# Patient Record
Sex: Female | Born: 1965 | Race: White | Hispanic: No | Marital: Married | Smoking: Former smoker
Health system: Southern US, Community
[De-identification: ages and names within clinical notes are randomized; demographics above are authoritative.]

## PROBLEM LIST (undated history)

## (undated) DIAGNOSIS — K219 Gastro-esophageal reflux disease without esophagitis: Secondary | ICD-10-CM

## (undated) DIAGNOSIS — J302 Other seasonal allergic rhinitis: Secondary | ICD-10-CM

## (undated) DIAGNOSIS — E785 Hyperlipidemia, unspecified: Secondary | ICD-10-CM

## (undated) DIAGNOSIS — I10 Essential (primary) hypertension: Secondary | ICD-10-CM

## (undated) HISTORY — PX: TONSILLECTOMY: SUR1361

## (undated) HISTORY — PX: APPENDECTOMY: SHX54

## (undated) HISTORY — DX: Essential (primary) hypertension: I10

## (undated) HISTORY — PX: TUBAL LIGATION: SHX77

## (undated) HISTORY — DX: Gastro-esophageal reflux disease without esophagitis: K21.9

---

## 2003-07-05 HISTORY — PX: CARPAL TUNNEL RELEASE: SHX101

## 2007-03-21 ENCOUNTER — Encounter: Admission: RE | Admit: 2007-03-21 | Discharge: 2007-04-10 | Payer: Self-pay | Admitting: Orthopedic Surgery

## 2008-04-16 ENCOUNTER — Emergency Department (HOSPITAL_COMMUNITY): Admission: EM | Admit: 2008-04-16 | Discharge: 2008-04-16 | Payer: Self-pay | Admitting: Emergency Medicine

## 2008-10-14 ENCOUNTER — Encounter: Admission: RE | Admit: 2008-10-14 | Discharge: 2008-10-14 | Payer: Self-pay | Admitting: Family Medicine

## 2009-10-30 ENCOUNTER — Encounter: Admission: RE | Admit: 2009-10-30 | Discharge: 2009-10-30 | Payer: Self-pay | Admitting: Family Medicine

## 2009-12-26 ENCOUNTER — Encounter: Admission: RE | Admit: 2009-12-26 | Discharge: 2009-12-26 | Payer: Self-pay | Admitting: Orthopedic Surgery

## 2010-08-03 ENCOUNTER — Other Ambulatory Visit: Payer: Self-pay | Admitting: Family Medicine

## 2010-08-03 DIAGNOSIS — Z1239 Encounter for other screening for malignant neoplasm of breast: Secondary | ICD-10-CM

## 2010-11-01 ENCOUNTER — Ambulatory Visit
Admission: RE | Admit: 2010-11-01 | Discharge: 2010-11-01 | Disposition: A | Discharge status: Home / Self Care | Payer: BC Managed Care – PPO | Source: Ambulatory Visit | Attending: Family Medicine | Admitting: Family Medicine

## 2010-11-01 DIAGNOSIS — Z1239 Encounter for other screening for malignant neoplasm of breast: Secondary | ICD-10-CM

## 2011-01-16 ENCOUNTER — Inpatient Hospital Stay (INDEPENDENT_AMBULATORY_CARE_PROVIDER_SITE_OTHER)
Admission: RE | Admit: 2011-01-16 | Discharge: 2011-01-16 | Disposition: A | Discharge status: Home / Self Care | Payer: BC Managed Care – PPO | Source: Ambulatory Visit | Attending: Family Medicine | Admitting: Family Medicine

## 2011-01-16 DIAGNOSIS — K625 Hemorrhage of anus and rectum: Secondary | ICD-10-CM

## 2011-01-16 DIAGNOSIS — K649 Unspecified hemorrhoids: Secondary | ICD-10-CM

## 2011-01-16 LAB — POCT I-STAT, CHEM 8
BUN: 18 mg/dL (ref 6–23)
Creatinine, Ser: 0.9 mg/dL (ref 0.50–1.10)
Hemoglobin: 15.3 g/dL — ABNORMAL HIGH (ref 12.0–15.0)
Potassium: 3.9 mEq/L (ref 3.5–5.1)
Sodium: 136 mEq/L (ref 135–145)

## 2011-04-05 LAB — URINALYSIS, ROUTINE W REFLEX MICROSCOPIC
Bilirubin Urine: NEGATIVE
Ketones, ur: NEGATIVE
Protein, ur: 100 — AB
Urobilinogen, UA: 0.2

## 2011-04-05 LAB — URINE CULTURE: Colony Count: >=100000

## 2011-04-05 LAB — URINE MICROSCOPIC-ADD ON

## 2011-11-01 ENCOUNTER — Other Ambulatory Visit: Payer: Self-pay | Admitting: Family Medicine

## 2011-11-01 DIAGNOSIS — Z1231 Encounter for screening mammogram for malignant neoplasm of breast: Secondary | ICD-10-CM

## 2011-11-10 ENCOUNTER — Ambulatory Visit: Payer: BC Managed Care – PPO

## 2011-11-11 ENCOUNTER — Ambulatory Visit
Admission: RE | Admit: 2011-11-11 | Discharge: 2011-11-11 | Disposition: A | Discharge status: Home / Self Care | Payer: BC Managed Care – PPO | Source: Ambulatory Visit | Attending: Family Medicine | Admitting: Family Medicine

## 2011-11-11 DIAGNOSIS — Z1231 Encounter for screening mammogram for malignant neoplasm of breast: Secondary | ICD-10-CM

## 2011-11-24 DIAGNOSIS — K219 Gastro-esophageal reflux disease without esophagitis: Secondary | ICD-10-CM | POA: Insufficient documentation

## 2011-11-24 DIAGNOSIS — E785 Hyperlipidemia, unspecified: Secondary | ICD-10-CM | POA: Insufficient documentation

## 2011-11-25 ENCOUNTER — Emergency Department (HOSPITAL_BASED_OUTPATIENT_CLINIC_OR_DEPARTMENT_OTHER)
Admission: EM | Admit: 2011-11-25 | Discharge: 2011-11-25 | Disposition: A | Discharge status: Home / Self Care | Payer: BC Managed Care – PPO | Attending: Emergency Medicine | Admitting: Emergency Medicine

## 2011-11-25 ENCOUNTER — Emergency Department (HOSPITAL_BASED_OUTPATIENT_CLINIC_OR_DEPARTMENT_OTHER): Payer: BC Managed Care – PPO

## 2011-11-25 ENCOUNTER — Encounter (HOSPITAL_BASED_OUTPATIENT_CLINIC_OR_DEPARTMENT_OTHER): Payer: Self-pay | Admitting: *Deleted

## 2011-11-25 DIAGNOSIS — K219 Gastro-esophageal reflux disease without esophagitis: Secondary | ICD-10-CM

## 2011-11-25 HISTORY — DX: Hyperlipidemia, unspecified: E78.5

## 2011-11-25 LAB — CBC
Hemoglobin: 13.9 g/dL (ref 12.0–15.0)
MCH: 29 pg (ref 26.0–34.0)
MCHC: 35.1 g/dL (ref 30.0–36.0)
RBC: 4.79 MIL/uL (ref 3.87–5.11)
RDW: 14.1 % (ref 11.5–15.5)
WBC: 9.3 10*3/uL (ref 4.0–10.5)

## 2011-11-25 LAB — TROPONIN I: Troponin I: <0.3 ng/mL (ref <0.30)

## 2011-11-25 LAB — BASIC METABOLIC PANEL
CO2: 26 mEq/L (ref 19–32)
Chloride: 100 mEq/L (ref 96–112)
Creatinine, Ser: 0.9 mg/dL (ref 0.50–1.10)
GFR calc non Af Amer: 75 mL/min — ABNORMAL LOW (ref >90)
Sodium: 139 mEq/L (ref 135–145)

## 2011-11-25 MED ORDER — GI COCKTAIL ~~LOC~~
30.0000 mL | Freq: Once | ORAL | Status: AC
  Administered 2011-11-25: 30 mL via ORAL
  Filled 2011-11-25: qty 30

## 2011-11-25 MED ORDER — SUCRALFATE 1 GM/10ML PO SUSP: 1.0000 g | Freq: Four times a day (QID) | ORAL | Status: DC

## 2011-11-25 NOTE — Discharge Instructions (Signed)
Diet for GERD or PUD Nutrition therapy can help ease the discomfort of gastroesophageal reflux disease (GERD) and peptic ulcer disease (PUD).  HOME CARE INSTRUCTIONS   Eat your meals slowly, in a relaxed setting.   Eat 5 to 6 small meals per day.   If a food causes distress, stop eating it for a period of time.  FOODS TO AVOID  Coffee, regular or decaffeinated.   Cola beverages, regular or low calorie.   Tea, regular or decaffeinated.   Pepper.   Cocoa.   High fat foods, including meats.   Butter, margarine, hydrogenated oil (trans fats).   Peppermint or spearmint (if you have GERD).   Fruits and vegetables if not tolerated.   Alcohol.   Nicotine (smoking or chewing). This is one of the most potent stimulants to acid production in the gastrointestinal tract.   Any food that seems to aggravate your condition.  If you have questions regarding your diet, ask your caregiver or a registered dietitian. TIPS  Lying flat may make symptoms worse. Keep the head of your bed raised 6 to 9 inches (15 to 23 cm) by using a foam wedge or blocks under the legs of the bed.   Do not lay down until 3 hours after eating a meal.   Daily physical activity may help reduce symptoms.  MAKE SURE YOU:   Understand these instructions.   Will watch your condition.   Will get help right away if you are not doing well or get worse.  Document Released: 06/20/2005 Document Revised: 06/09/2011 Document Reviewed: 05/06/2011 [employer] Patient Information 2012 [employer]. 

## 2011-11-25 NOTE — ED Notes (Signed)
Pt states that she just arrived home on an airplane and noticed that her feet were swollen also ate a sandwich at the airport and reports indigestion. Pt states that she took her BP when she got home and it was elevated pt BP was 130/100 pt denies any chest pain

## 2011-11-25 NOTE — ED Provider Notes (Signed)
History     CSN: [id number]  Arrival date & time 11/24/11  2356   First MD Initiated Contact with Patient 11/25/11 0145      Chief Complaint  Patient presents with  . Leg Swelling  . Hypertension    (Consider location/radiation/quality/duration/timing/severity/associated sxs/prior treatment) Patient is a 46 y.o. female presenting with hypertension. The history is provided by the patient. No language interpreter was used.  Hypertension This is a new problem. The current episode started yesterday. The problem occurs constantly. The problem has not changed since onset.Pertinent negatives include no chest pain, no abdominal pain, no headaches and no shortness of breath. The symptoms are aggravated by nothing. The symptoms are relieved by nothing. She has tried nothing for the symptoms. The treatment provided no relief.  Also noted peripheral edema and indigestion lasting > 12 hours constantly.  No SOB, no DOE, no n/v/d.  No palpitations.    Past Medical History  Diagnosis Date  . Hyperlipemia     Past Surgical History  Procedure Date  . Appendectomy   . Tonsillectomy   . Tubal ligation     History reviewed. No pertinent family history.  History  Substance Use Topics  . Smoking status: Never Smoker   . Smokeless tobacco: Not on file  . Alcohol Use: No    OB History    Grav Para Term Preterm Abortions TAB SAB Ect Mult Living                  Review of Systems  Respiratory: Positive for chest tightness. Negative for shortness of breath.   Cardiovascular: Negative for chest pain and palpitations.  Gastrointestinal: Negative for abdominal pain.  Neurological: Negative for headaches.  All other systems reviewed and are negative.    Allergies  Ceclor; Sulfa antibiotics; and Tetracyclines & related  Home Medications   Current Outpatient Rx  Name Route Sig Dispense Refill  . FEXOFENADINE HCL 180 MG PO TABS Oral Take 180 mg by mouth daily.    . SUCRALFATE 1 GM/10ML  PO SUSP Oral Take 10 mLs (1 g total) by mouth 4 (four) times daily. 420 mL 0    BP 154/100  Pulse 82  Temp(Src) 98.4 F (36.9 C) (Oral)  Resp 20  SpO2 98%  LMP 10/26/2011  Physical Exam  Constitutional: She is oriented to person, place, and time. She appears well-developed and well-nourished. No distress.  HENT:  Head: Normocephalic and atraumatic.  Mouth/Throat: Oropharynx is clear and moist.  Eyes: Conjunctivae are normal. Pupils are equal, round, and reactive to light.  Neck: Normal range of motion. Neck supple.  Cardiovascular: Normal rate and regular rhythm.   Pulmonary/Chest: Effort normal and breath sounds normal. She has no wheezes. She has no rales.  Abdominal: Soft. Bowel sounds are normal. There is no tenderness. There is no rebound and no guarding.  Neurological: She is alert and oriented to person, place, and time.  Skin: Skin is warm and dry. She is not diaphoretic.  Psychiatric: She has a normal mood and affect.    ED Course  Procedures (including critical care time)  Labs Reviewed  BASIC METABOLIC PANEL - Abnormal; Notable for the following:    GFR calc non Af Amer 75 (*)    GFR calc Af Amer 87 (*)    All other components within normal limits  CBC  LIPASE, BLOOD  TROPONIN I   Dg Chest 2 View  11/25/2011  *RADIOLOGY REPORT*  Clinical Data: Leg swelling, hypertension.  CHEST -  2 VIEW  Comparison: None.  Findings: Hypoaeration results in interstitial and vascular crowding.  Elevated hemidiaphragms.  Allowing for this, no focal consolidation, pleural effusion, pneumothorax.  Cardiomediastinal contours within normal limits.  No acute osseous finding.  IMPRESSION: Hypoaeration with interstitial and vascular crowding.  No focal consolidation.  Original Report Authenticated By: Waneta Martins, M.D.     1. GERD (gastroesophageal reflux disease)      Date: 11/25/2011  Rate: 75  Rhythm: normal sinus rhythm  QRS Axis: normal  Intervals: normal  ST/T Wave  abnormalities: normal  Conduction Disutrbances: none  Narrative Interpretation: unremarkable     MDM  Doubt ACS.  1 normal EKG with one negative troponin in the setting of > 8 hrs indigestion, especially in the light of symptoms resolving with a GI cocktail is sufficient.  Follow up with your family doctor for ongoing care return for chest pain shortness of breath or any concerns.  Patient verbalizes understanding and agrees to follow up        Jorgen Wolfinger Smitty Cords, MD 11/25/11 1610

## 2011-11-29 ENCOUNTER — Other Ambulatory Visit: Payer: Self-pay | Admitting: Family Medicine

## 2011-11-30 ENCOUNTER — Ambulatory Visit
Admission: RE | Admit: 2011-11-30 | Discharge: 2011-11-30 | Disposition: A | Discharge status: Home / Self Care | Payer: BC Managed Care – PPO | Source: Ambulatory Visit | Attending: Family Medicine | Admitting: Family Medicine

## 2011-12-02 ENCOUNTER — Other Ambulatory Visit: Payer: BC Managed Care – PPO

## 2011-12-06 ENCOUNTER — Other Ambulatory Visit: Payer: Self-pay | Admitting: Gastroenterology

## 2011-12-06 DIAGNOSIS — R1013 Epigastric pain: Secondary | ICD-10-CM

## 2011-12-19 ENCOUNTER — Encounter (HOSPITAL_COMMUNITY)
Admission: RE | Admit: 2011-12-19 | Discharge: 2011-12-19 | Disposition: A | Discharge status: Home / Self Care | Payer: BC Managed Care – PPO | Source: Ambulatory Visit | Attending: Gastroenterology | Admitting: Gastroenterology

## 2011-12-19 DIAGNOSIS — R1013 Epigastric pain: Secondary | ICD-10-CM | POA: Insufficient documentation

## 2011-12-19 MED ORDER — TECHNETIUM TC 99M MEBROFENIN IV KIT
5.0000 | PACK | Freq: Once | INTRAVENOUS | Status: AC | PRN
  Administered 2011-12-19: 5 via INTRAVENOUS

## 2011-12-19 MED ORDER — SINCALIDE 5 MCG IJ SOLR
0.0200 ug/kg | Freq: Once | INTRAMUSCULAR | Status: AC
  Administered 2011-12-19: 1.8 ug via INTRAVENOUS

## 2011-12-19 MED ORDER — SINCALIDE 5 MCG IJ SOLR
INTRAMUSCULAR | Status: AC
  Filled 2011-12-19: qty 5

## 2012-02-16 ENCOUNTER — Ambulatory Visit (INDEPENDENT_AMBULATORY_CARE_PROVIDER_SITE_OTHER): Payer: BC Managed Care – PPO | Admitting: General Surgery

## 2012-02-28 ENCOUNTER — Encounter (INDEPENDENT_AMBULATORY_CARE_PROVIDER_SITE_OTHER): Payer: Self-pay | Admitting: General Surgery

## 2012-02-28 ENCOUNTER — Ambulatory Visit (INDEPENDENT_AMBULATORY_CARE_PROVIDER_SITE_OTHER): Payer: BC Managed Care – PPO | Admitting: General Surgery

## 2012-02-28 VITALS — BP 150/100 | HR 60 | Temp 97.2°F | Resp 16 | Ht 58.5 in | Wt 199.6 lb

## 2012-02-28 DIAGNOSIS — K828 Other specified diseases of gallbladder: Secondary | ICD-10-CM | POA: Insufficient documentation

## 2012-02-28 NOTE — Progress Notes (Signed)
Patient ID: Jackie Waller, female   DOB: 03-Jun-1966, 46 y.o.   MRN: [id number]  Chief Complaint  Patient presents with  . Pre-op Exam    eval GB - mid center pain    HPI Jackie Waller is a 47 y.o. female.  Abdominal pain in epigastrium and RUQ.  HIDA with EF of 43%, but paitne symptomatic with injection.  Had been told she needed Cholecystectomy 10 years ago but refused as she was recovering from [place]y.  Avoids fatty foods with improvement.  No jaundice HPI  Past Medical History  Diagnosis Date  . Hyperlipemia   . Hypertension   . GERD (gastroesophageal reflux disease)     Past Surgical History  Procedure Date  . Appendectomy   . Tonsillectomy   . Tubal ligation   . Carpal tunnel release 2005    Family History  Problem Relation Age of Onset  . Hyperlipidemia Mother   . Hypertension Mother   . Heart disease Mother   . Cancer Father     prostate  . Hyperlipidemia Father   . Hypertension Father   . Heart disease Father   . Cancer Maternal Aunt     non-hodgins lymphoma  . Heart disease Maternal Grandmother   . Diabetes Maternal Grandfather   . Heart disease Maternal Grandfather   . Stroke Paternal Grandmother   . Heart disease Paternal Grandmother     Social History History  Substance Use Topics  . Smoking status: Former Games developer  . Smokeless tobacco: Never Used  . Alcohol Use: No     2 galsses of wine per week    Allergies  Allergen Reactions  . Ceclor (Cefaclor)   . Sulfa Antibiotics   . Tetracyclines & Related     Current Outpatient Prescriptions  Medication Sig Dispense Refill  . fexofenadine (ALLEGRA) 180 MG tablet Take 180 mg by mouth daily.        Review of Systems Review of Systems  Constitutional: Negative.   HENT: Negative.   Eyes: Negative.   Respiratory: Negative.   Cardiovascular: Negative.   Gastrointestinal: Positive for nausea and abdominal pain. Negative for vomiting, diarrhea and blood in stool.  Genitourinary:  Negative.   Musculoskeletal: Negative.   Neurological: Negative.   Hematological: Negative.   Psychiatric/Behavioral: Negative.     Blood pressure 150/100, pulse 60, temperature 97.2 F (36.2 C), temperature source Temporal, resp. rate 16, height 4' 10.5" (1.486 m), weight 199 lb 9.6 oz (90.538 kg), last menstrual period 02/06/2012.  Physical Exam Physical Exam  Constitutional: She appears well-developed and well-nourished.  HENT:  Head: Normocephalic and atraumatic.  Eyes: Conjunctivae and EOM are normal. Pupils are equal, round, and reactive to light.  Cardiovascular: Normal rate, regular rhythm and normal heart sounds.   Pulmonary/Chest: Effort normal and breath sounds normal.  Abdominal: Soft. Normal appearance and bowel sounds are normal. There is tenderness in the right upper quadrant and epigastric area. There is no rebound and no guarding.    Musculoskeletal: Normal range of motion.  Neurological: She is alert. She has normal reflexes.  Skin: Skin is warm and dry.  Psychiatric: She has a normal mood and affect. Her behavior is normal. Judgment and thought content normal.    Data Reviewed Office notes from Dr. Loreta Ave, labs, endoscopy report with biopsy.  Assessment    Symptomatic biliary dyskinesia    Plan    Lap chole with IOC Risks and benefits have been explained to the patient and her husband and they  wish to proceed.       Cherylynn Ridges 02/28/2012, 10:09 AM

## 2012-03-21 ENCOUNTER — Encounter (INDEPENDENT_AMBULATORY_CARE_PROVIDER_SITE_OTHER): Payer: Self-pay

## 2012-03-26 ENCOUNTER — Encounter (HOSPITAL_COMMUNITY): Payer: Self-pay | Admitting: Pharmacy Technician

## 2012-03-28 ENCOUNTER — Encounter (HOSPITAL_COMMUNITY): Payer: Self-pay

## 2012-03-28 ENCOUNTER — Encounter (HOSPITAL_COMMUNITY): Admission: RE | Admit: 2012-03-28 | Payer: BC Managed Care – PPO | Source: Ambulatory Visit

## 2012-03-28 ENCOUNTER — Encounter (HOSPITAL_COMMUNITY)
Admission: RE | Admit: 2012-03-28 | Discharge: 2012-03-28 | Disposition: A | Discharge status: Home / Self Care | Payer: BC Managed Care – PPO | Source: Ambulatory Visit | Attending: General Surgery | Admitting: General Surgery

## 2012-03-28 HISTORY — DX: Other seasonal allergic rhinitis: J30.2

## 2012-03-28 LAB — CBC WITH DIFFERENTIAL/PLATELET
Basophils Relative: 0 % (ref 0–1)
Eosinophils Absolute: 0.2 10*3/uL (ref 0.0–0.7)
Eosinophils Relative: 2 % (ref 0–5)
HCT: 39.7 % (ref 36.0–46.0)
Hemoglobin: 13.3 g/dL (ref 12.0–15.0)
Lymphocytes Relative: 23 % (ref 12–46)
MCHC: 33.5 g/dL (ref 30.0–36.0)
Monocytes Relative: 9 % (ref 3–12)
Neutrophils Relative %: 66 % (ref 43–77)
RBC: 4.66 MIL/uL (ref 3.87–5.11)
Smear Review: ADEQUATE
WBC: 9.6 10*3/uL (ref 4.0–10.5)

## 2012-03-28 LAB — COMPREHENSIVE METABOLIC PANEL
ALT: 17 U/L (ref 0–35)
AST: 17 U/L (ref 0–37)
Alkaline Phosphatase: 96 U/L (ref 39–117)
CO2: 25 mEq/L (ref 19–32)
Calcium: 9.7 mg/dL (ref 8.4–10.5)
GFR calc non Af Amer: 86 mL/min — ABNORMAL LOW (ref >90)
Potassium: 3.9 mEq/L (ref 3.5–5.1)
Sodium: 137 mEq/L (ref 135–145)
Total Protein: 7 g/dL (ref 6.0–8.3)

## 2012-03-28 LAB — HCG, SERUM, QUALITATIVE: Preg, Serum: NEGATIVE

## 2012-03-28 LAB — SURGICAL PCR SCREEN: Staphylococcus aureus: NEGATIVE

## 2012-03-28 NOTE — Pre-Procedure Instructions (Signed)
20 Jackie Waller  03/28/2012   Your procedure is scheduled on:  Tuesday October 1  Report to [facility] at 5:30 AM.  Call this number if you have problems the morning of surgery: [phone]   Remember:   Do not eat or drink:After Midnight.    Take these medicines the morning of surgery with A SIP OF WATER: Allegra   Do not wear jewelry, make-up or nail polish.  Do not wear lotions, powders, or perfumes. You may wear deodorant.  Do not shave 48 hours prior to surgery. Men may shave face and neck.  Do not bring valuables to the hospital.  Contacts, dentures or bridgework may not be worn into surgery.  Leave suitcase in the car. After surgery it may be brought to your room.  For patients admitted to the hospital, checkout time is 11:00 AM the day of discharge.   Patients discharged the day of surgery will not be allowed to drive home.  Name and phone number of your driver: family/friend  Special Instructions: Shower using CHG 2 nights before surgery and the night before surgery.  If you shower the day of surgery use CHG.  Use special wash - you have one bottle of CHG for all showers.  You should use approximately 1/3 of the bottle for each shower.   Please read over the following fact sheets that you were given: Pain Booklet, Coughing and Deep Breathing and Surgical Site Infection Prevention

## 2012-03-29 NOTE — Consult Note (Signed)
Anesthesia chart review: Patient is a 46 year old female scheduled for laparoscopic cholecystectomy on 04/03/2012 by Dr. Lindie Spruce. History includes former smoker, morbid obesity, hyperlipidemia, hypertension, GERD.  PCP is Dr. Benedetto Goad.  CXR report on 11/25/11 showed: Hypoaeration results in interstitial and vascular crowding. Elevated hemidiaphragms. Allowing for this, no focal consolidation, pleural effusion, pneumothorax. Cardiomediastinal contours within normal limits. No acute osseous finding.   EKG on 11/25/11 showed NSR.  Labs noted.  WBC morphology showed toxic granulation. She was afebrile with stable VS at her PAT appointment.      If no significant change in her status then anticipate she can proceed as planned.  Shonna Chock, PA-C

## 2012-04-02 MED ORDER — CIPROFLOXACIN IN D5W 400 MG/200ML IV SOLN
400.0000 mg | INTRAVENOUS | Status: AC
  Administered 2012-04-03: 400 mg via INTRAVENOUS
  Filled 2012-04-02: qty 200

## 2012-04-03 ENCOUNTER — Encounter (HOSPITAL_COMMUNITY): Payer: Self-pay | Admitting: *Deleted

## 2012-04-03 ENCOUNTER — Ambulatory Visit (HOSPITAL_COMMUNITY)
Admission: RE | Admit: 2012-04-03 | Discharge: 2012-04-03 | Disposition: A | Discharge status: Home / Self Care | Payer: BC Managed Care – PPO | Source: Ambulatory Visit | Attending: General Surgery | Admitting: General Surgery

## 2012-04-03 ENCOUNTER — Ambulatory Visit (HOSPITAL_COMMUNITY): Payer: BC Managed Care – PPO | Admitting: Vascular Surgery

## 2012-04-03 ENCOUNTER — Ambulatory Visit (HOSPITAL_COMMUNITY): Payer: BC Managed Care – PPO

## 2012-04-03 ENCOUNTER — Encounter (HOSPITAL_COMMUNITY): Payer: Self-pay | Admitting: Vascular Surgery

## 2012-04-03 ENCOUNTER — Encounter (HOSPITAL_COMMUNITY)
Admission: RE | Disposition: A | Discharge status: Home / Self Care | Payer: Self-pay | Source: Ambulatory Visit | Attending: General Surgery

## 2012-04-03 DIAGNOSIS — E785 Hyperlipidemia, unspecified: Secondary | ICD-10-CM | POA: Insufficient documentation

## 2012-04-03 DIAGNOSIS — Z01812 Encounter for preprocedural laboratory examination: Secondary | ICD-10-CM | POA: Insufficient documentation

## 2012-04-03 DIAGNOSIS — K828 Other specified diseases of gallbladder: Secondary | ICD-10-CM

## 2012-04-03 DIAGNOSIS — K811 Chronic cholecystitis: Secondary | ICD-10-CM

## 2012-04-03 DIAGNOSIS — I10 Essential (primary) hypertension: Secondary | ICD-10-CM | POA: Insufficient documentation

## 2012-04-03 DIAGNOSIS — K219 Gastro-esophageal reflux disease without esophagitis: Secondary | ICD-10-CM | POA: Insufficient documentation

## 2012-04-03 HISTORY — PX: CHOLECYSTECTOMY: SHX55

## 2012-04-03 SURGERY — LAPAROSCOPIC CHOLECYSTECTOMY WITH INTRAOPERATIVE CHOLANGIOGRAM
Anesthesia: General | Site: Abdomen | Wound class: Clean Contaminated

## 2012-04-03 MED ORDER — PROPOFOL 10 MG/ML IV BOLUS
INTRAVENOUS | Status: DC | PRN
  Administered 2012-04-03: 150 mg via INTRAVENOUS

## 2012-04-03 MED ORDER — SODIUM CHLORIDE 0.9 % IV SOLN
INTRAVENOUS | Status: DC | PRN
  Administered 2012-04-03: 08:00:00

## 2012-04-03 MED ORDER — SUCCINYLCHOLINE CHLORIDE 20 MG/ML IJ SOLN
INTRAMUSCULAR | Status: DC | PRN
  Administered 2012-04-03: 100 mg via INTRAVENOUS

## 2012-04-03 MED ORDER — HYDROMORPHONE HCL PF 1 MG/ML IJ SOLN
INTRAMUSCULAR | Status: AC
  Filled 2012-04-03: qty 1

## 2012-04-03 MED ORDER — DEXAMETHASONE SODIUM PHOSPHATE 4 MG/ML IJ SOLN
INTRAMUSCULAR | Status: DC | PRN
  Administered 2012-04-03: 4 mg via INTRAVENOUS

## 2012-04-03 MED ORDER — ONDANSETRON HCL 4 MG/2ML IJ SOLN
INTRAMUSCULAR | Status: DC | PRN
  Administered 2012-04-03: 4 mg via INTRAVENOUS

## 2012-04-03 MED ORDER — MIDAZOLAM HCL 2 MG/2ML IJ SOLN
0.5000 mg | Freq: Once | INTRAMUSCULAR | Status: DC | PRN
Start: 2012-04-03 — End: 2012-04-03

## 2012-04-03 MED ORDER — FENTANYL CITRATE 0.05 MG/ML IJ SOLN
INTRAMUSCULAR | Status: DC | PRN
  Administered 2012-04-03 (×2): 50 ug via INTRAVENOUS
  Administered 2012-04-03: 100 ug via INTRAVENOUS
  Administered 2012-04-03: 50 ug via INTRAVENOUS
  Administered 2012-04-03: 100 ug via INTRAVENOUS

## 2012-04-03 MED ORDER — MEPERIDINE HCL 25 MG/ML IJ SOLN: 6.2500 mg | INTRAMUSCULAR | Status: DC | PRN

## 2012-04-03 MED ORDER — LIDOCAINE HCL (CARDIAC) 20 MG/ML IV SOLN
INTRAVENOUS | Status: DC | PRN
  Administered 2012-04-03: 30 mg via INTRAVENOUS

## 2012-04-03 MED ORDER — PROMETHAZINE HCL 25 MG/ML IJ SOLN: 6.2500 mg | INTRAMUSCULAR | Status: DC | PRN

## 2012-04-03 MED ORDER — DEXTROSE 5 % IV SOLN
INTRAVENOUS | Status: DC | PRN
  Administered 2012-04-03: 07:00:00 via INTRAVENOUS

## 2012-04-03 MED ORDER — 0.9 % SODIUM CHLORIDE (POUR BTL) OPTIME
TOPICAL | Status: DC | PRN
  Administered 2012-04-03: 1000 mL

## 2012-04-03 MED ORDER — NEOSTIGMINE METHYLSULFATE 1 MG/ML IJ SOLN
INTRAMUSCULAR | Status: DC | PRN
  Administered 2012-04-03: 4 mg via INTRAVENOUS

## 2012-04-03 MED ORDER — GLYCOPYRROLATE 0.2 MG/ML IJ SOLN
INTRAMUSCULAR | Status: DC | PRN
  Administered 2012-04-03: 0.6 mg via INTRAVENOUS

## 2012-04-03 MED ORDER — HYDROMORPHONE HCL PF 1 MG/ML IJ SOLN
0.2500 mg | INTRAMUSCULAR | Status: DC | PRN
  Administered 2012-04-03: 0.5 mg via INTRAVENOUS

## 2012-04-03 MED ORDER — MORPHINE SULFATE 10 MG/ML IJ SOLN
INTRAMUSCULAR | Status: DC | PRN
  Administered 2012-04-03 (×2): 2.5 mg via INTRAVENOUS

## 2012-04-03 MED ORDER — ROCURONIUM BROMIDE 100 MG/10ML IV SOLN
INTRAVENOUS | Status: DC | PRN
  Administered 2012-04-03: 10 mg via INTRAVENOUS
  Administered 2012-04-03: 30 mg via INTRAVENOUS

## 2012-04-03 MED ORDER — LACTATED RINGERS IV SOLN
INTRAVENOUS | Status: DC | PRN
  Administered 2012-04-03: 07:00:00 via INTRAVENOUS

## 2012-04-03 MED ORDER — BUPIVACAINE-EPINEPHRINE PF 0.25-1:200000 % IJ SOLN
INTRAMUSCULAR | Status: AC
  Filled 2012-04-03: qty 30

## 2012-04-03 MED ORDER — HYDROCODONE-ACETAMINOPHEN 5-500 MG PO TABS: 1.0000 | ORAL_TABLET | Freq: Four times a day (QID) | ORAL | Status: AC | PRN

## 2012-04-03 MED ORDER — SODIUM CHLORIDE 0.9 % IR SOLN
Status: DC | PRN
  Administered 2012-04-03: 1000 mL

## 2012-04-03 MED ORDER — BUPIVACAINE-EPINEPHRINE 0.25% -1:200000 IJ SOLN
INTRAMUSCULAR | Status: DC | PRN
  Administered 2012-04-03: 17 mL

## 2012-04-03 SURGICAL SUPPLY — 47 items
APPLIER CLIP 5 13 M/L LIGAMAX5 (MISCELLANEOUS) ×2
APPLIER CLIP ROT 10 11.4 M/L (STAPLE)
BLADE SURG ROTATE 9660 (MISCELLANEOUS) IMPLANT
CANISTER SUCTION 2500CC (MISCELLANEOUS) ×2 IMPLANT
CHLORAPREP W/TINT 26ML (MISCELLANEOUS) ×2 IMPLANT
CLIP APPLIE 5 13 M/L LIGAMAX5 (MISCELLANEOUS) ×1 IMPLANT
CLIP APPLIE ROT 10 11.4 M/L (STAPLE) IMPLANT
CLOTH BEACON ORANGE TIMEOUT ST (SAFETY) ×2 IMPLANT
COVER MAYO STAND STRL (DRAPES) ×2 IMPLANT
COVER SURGICAL LIGHT HANDLE (MISCELLANEOUS) ×2 IMPLANT
DECANTER SPIKE VIAL GLASS SM (MISCELLANEOUS) ×4 IMPLANT
DERMABOND ADVANCED (GAUZE/BANDAGES/DRESSINGS) ×1
DERMABOND ADVANCED .7 DNX12 (GAUZE/BANDAGES/DRESSINGS) ×1 IMPLANT
DRAPE C-ARM 42X72 X-RAY (DRAPES) ×2 IMPLANT
DRAPE UTILITY 15X26 W/TAPE STR (DRAPE) ×4 IMPLANT
DRSG TEGADERM 4X4.75 (GAUZE/BANDAGES/DRESSINGS) ×2 IMPLANT
ELECT REM PT RETURN 9FT ADLT (ELECTROSURGICAL) ×2
ELECTRODE REM PT RTRN 9FT ADLT (ELECTROSURGICAL) ×1 IMPLANT
GLOVE BIOGEL PI IND STRL 6.5 (GLOVE) ×1 IMPLANT
GLOVE BIOGEL PI IND STRL 7.0 (GLOVE) ×2 IMPLANT
GLOVE BIOGEL PI IND STRL 8 (GLOVE) ×1 IMPLANT
GLOVE BIOGEL PI INDICATOR 6.5 (GLOVE) ×1
GLOVE BIOGEL PI INDICATOR 7.0 (GLOVE) ×2
GLOVE BIOGEL PI INDICATOR 8 (GLOVE) ×1
GLOVE ECLIPSE 6.5 STRL STRAW (GLOVE) ×2 IMPLANT
GLOVE ECLIPSE 7.5 STRL STRAW (GLOVE) ×2 IMPLANT
GLOVE SURG SS PI 7.0 STRL IVOR (GLOVE) ×2 IMPLANT
GOWN STRL NON-REIN LRG LVL3 (GOWN DISPOSABLE) ×6 IMPLANT
KIT BASIN OR (CUSTOM PROCEDURE TRAY) ×2 IMPLANT
KIT ROOM TURNOVER OR (KITS) ×2 IMPLANT
NS IRRIG 1000ML POUR BTL (IV SOLUTION) ×2 IMPLANT
PAD ARMBOARD 7.5X6 YLW CONV (MISCELLANEOUS) ×4 IMPLANT
POUCH SPECIMEN RETRIEVAL 10MM (ENDOMECHANICALS) ×2 IMPLANT
SCISSORS LAP 5X35 DISP (ENDOMECHANICALS) IMPLANT
SET CHOLANGIOGRAPH 5 50 .035 (SET/KITS/TRAYS/PACK) ×2 IMPLANT
SET IRRIG TUBING LAPAROSCOPIC (IRRIGATION / IRRIGATOR) ×2 IMPLANT
SLEEVE ENDOPATH XCEL 5M (ENDOMECHANICALS) ×4 IMPLANT
SPECIMEN JAR SMALL (MISCELLANEOUS) ×2 IMPLANT
STRIP CLOSURE SKIN 1/2X4 (GAUZE/BANDAGES/DRESSINGS) ×2 IMPLANT
SUT MNCRL AB 4-0 PS2 18 (SUTURE) ×2 IMPLANT
TOWEL OR 17X24 6PK STRL BLUE (TOWEL DISPOSABLE) ×2 IMPLANT
TOWEL OR 17X26 10 PK STRL BLUE (TOWEL DISPOSABLE) ×2 IMPLANT
TRAY LAPAROSCOPIC (CUSTOM PROCEDURE TRAY) ×2 IMPLANT
TROCAR XCEL BLUNT TIP 100MML (ENDOMECHANICALS) ×2 IMPLANT
TROCAR XCEL NON-BLD 11X100MML (ENDOMECHANICALS) IMPLANT
TROCAR XCEL NON-BLD 5MMX100MML (ENDOMECHANICALS) ×2 IMPLANT
WATER STERILE IRR 1000ML POUR (IV SOLUTION) IMPLANT

## 2012-04-03 NOTE — Progress Notes (Signed)
Report given to stephanie rn as caregiver 

## 2012-04-03 NOTE — Transfer of Care (Signed)
Immediate Anesthesia Transfer of Care Note  Patient: Jackie Waller  Procedure(s) Performed: Procedure(s) (LRB) with comments: LAPAROSCOPIC CHOLECYSTECTOMY WITH INTRAOPERATIVE CHOLANGIOGRAM (N/A)  Patient Location: PACU  Anesthesia Type: General  Level of Consciousness: awake, oriented and patient cooperative  Airway & Oxygen Therapy: Patient Spontanous Breathing and Patient connected to nasal cannula oxygen  Post-op Assessment: Report given to PACU RN and Post -op Vital signs reviewed and stable  Post vital signs: Reviewed and stable  Complications: No apparent anesthesia complications

## 2012-04-03 NOTE — Op Note (Signed)
OPERATIVE REPORT  DATE OF OPERATION: 04/03/2012  PATIENT:  Jackie Waller  46 y.o. female  PRE-OPERATIVE DIAGNOSIS:  biliary dyskinesia  POST-OPERATIVE DIAGNOSIS:  biliary dyskinesia  PROCEDURE:  Procedure(s): LAPAROSCOPIC CHOLECYSTECTOMY WITH INTRAOPERATIVE CHOLANGIOGRAM  SURGEON:  Surgeon(s): Jackie Ridges, MD  ASSISTANT: None  ANESTHESIA:   general  EBL: <20 ml  BLOOD ADMINISTERED: none  DRAINS: none   SPECIMEN:  Source of Specimen:  gallbladder  COUNTS CORRECT:  YES  PROCEDURE DETAILS: The patient was taken to the operating room and placed on the table in the supine position.  After an adequate endotracheal anesthetic was administered, she/ was prepped with ChloroPrep, and then draped in the usual manner exposing the entire abdomen laterally, inferiorly and up  to the costal margins.  After a proper timeout was performed including identifying the patient and the procedure to be performed, a Infra-umbilical1.5cm midline incision was made using a (#15/#11) blade.  This was taken down to the fascia which was then incised with a #15 blade.  The edges of the fascia were tented up with Kocher clamps as the preperitoneal space was penetrated with a Kelly clamp into the peritoneum.  Once this was done, a pursestring suture of 0 Vicryl was passed around the fascial opening.  This was subsequently used to secure the [facility] cannula which was passed into the peritoneal cavity.  Once the [facility] cannula was in place, carbon dioxide gas was insufflated into the peritoneal cavity up to a maximal intra-abdominal pressure of 15mm Hg.The laparoscope, with attached camera and light source, was passed into the peritoneal cavity to visualize the direct insertion of two right upper quadrant 5mm cannulas, and a sup-xiphoid 5mm cannula.  Once all cannulas were in place, the dissection was begun.  Two ratcheted graspers were attached to the dome and infundibulum of the gallbladder and  retracted towards the anterior abdominal wall and the right upper quadrant.   There were some omental adhesions attached to the gallbladder that were bluntly dissected away.  There were also adhesions between the liver capsule and the diaphragm that were taken down at the end of the procedure.  Using cautery attached to a dissecting forceps, the peritoneum overlaying the triangle of Calot and the hepatoduodenal triangle was dissected away exposing the cystic duct and the cystic artery.  A clip was placed on the gallbladder side of the cystic duct, then a cholecytodochotomy made using the laparoscopic scissors.  Through the cholecystodochotomy a Cook catheter was passed to performed a cholangiogram.  The cholangiogram showed Good proximal filling, good distal filling and flow into the duodenum.  No extravasation..  Once the cholangiogram was completed, the [facility] catheter was removed, and the distal cystic duct was clipped multiple times then transected.  Inspecting the gallbladder bed carefully there was some bile staining that appear to be coming from the retracted gallbladder and the cystic duct, but there was a small 1mm tubular structure that was visible and subsequently clipped which may have been a duct of Luschka. The gallbladder was then dissected out of the hepatic bed without event.  It was retrieved from the abdomen (using an EndoCatch bag) without event.  Once the gallbladder was removed, the bed was inspected for hemostasis.  Once excellent hemostasis was obtained all gas and fluids were aspirated from above the liver, then the cannulas were removed.  The infra-umbilical incision was closed using the pursestring suture which was in place.  0.25% bupivicaine with epinephrine was injected at all sites.  All  10mm or greater cannula sites were close using a running subcuticular stitch of 4-0 Monocryl.  5.75mm cannula sites were closed with Dermabond only.Steri-Strips and Tagaderm were used to complete  the dressings at all sites.  At this point all needle, sponge, and instrument counts were correct.The patient was awakened from anesthesia and taken to the PACU in stable condition.  PATIENT DISPOSITION:  PACU - hemodynamically stable.   [name] O 10/1/20138:50 AM

## 2012-04-03 NOTE — Progress Notes (Signed)
Pt has 4 ports/wounds across upper abd ... And one(5th )at the umbilicus.  All are cd and i .. With  steristrips and  Occlusive dressing .

## 2012-04-03 NOTE — Anesthesia Preprocedure Evaluation (Addendum)
Anesthesia Evaluation  Patient identified by MRN, date of birth, ID band Patient awake    Reviewed: Allergy & Precautions, H&P , NPO status , Patient's Chart, lab work & pertinent test results  History of Anesthesia Complications Negative for: history of anesthetic complications  Airway Mallampati: III TM Distance: >3 FB Neck ROM: Full    Dental No notable dental hx. (+) Teeth Intact and Dental Advisory Given   Pulmonary neg pulmonary ROS, former smoker (quit 10 years),  breath sounds clear to auscultation  Pulmonary exam normal       Cardiovascular hypertension, Pt. on medications Rhythm:Regular Rate:Normal     Neuro/Psych negative neurological ROS     GI/Hepatic Neg liver ROS, GERD-  Medicated and Controlled,  Endo/Other  Morbid obesity  Renal/GU negative Renal ROS     Musculoskeletal negative musculoskeletal ROS (+)   Abdominal (+) + obese,   Peds  Hematology negative hematology ROS (+)   Anesthesia Other Findings   Reproductive/Obstetrics                          Anesthesia Physical Anesthesia Plan  ASA: II  Anesthesia Plan: General   Post-op Pain Management:    Induction: Intravenous  Airway Management Planned: Oral ETT and Video Laryngoscope Planned  Additional Equipment:   Intra-op Plan:   Post-operative Plan: Extubation in OR  Informed Consent: I have reviewed the patients History and Physical, chart, labs and discussed the procedure including the risks, benefits and alternatives for the proposed anesthesia with the patient or authorized representative who has indicated his/her understanding and acceptance.   Dental advisory given  Plan Discussed with: CRNA and Surgeon  Anesthesia Plan Comments: (Plan routine monitors, GETA)        Anesthesia Quick Evaluation

## 2012-04-03 NOTE — Anesthesia Postprocedure Evaluation (Signed)
  Anesthesia Post-op Note  Patient: Jackie Waller  Procedure(s) Performed: Procedure(s) (LRB) with comments: LAPAROSCOPIC CHOLECYSTECTOMY WITH INTRAOPERATIVE CHOLANGIOGRAM (N/A)  Patient Location: PACU  Anesthesia Type: General  Level of Consciousness: awake, alert , oriented and patient cooperative  Airway and Oxygen Therapy: Patient Spontanous Breathing  Post-op Pain: none  Post-op Assessment: Post-op Vital signs reviewed, Patient's Cardiovascular Status Stable, Respiratory Function Stable, Patent Airway, No signs of Nausea or vomiting and Pain level controlled  Post-op Vital Signs: Reviewed and stable  Complications: No apparent anesthesia complications

## 2012-04-03 NOTE — Preoperative (Signed)
Beta Blockers   Reason not to administer Beta Blockers:Not Applicable 

## 2012-04-03 NOTE — H&P (Signed)
Chief Complaint   Patient presents with   .  Pre-op Exam     eval GB - mid center pain    HPI  Jackie Waller is a 46 y.o. female. Abdominal pain in epigastrium and RUQ. HIDA with EF of 43%, but paitne symptomatic with injection. Had been told she needed Cholecystectomy 10 years ago but refused as she was recovering from [place]y. Avoids fatty foods with improvement. No jaundice  HPI  Past Medical History   Diagnosis  Date   .  Hyperlipemia    .  Hypertension    .  GERD (gastroesophageal reflux disease)     Past Surgical History   Procedure  Date   .  Appendectomy    .  Tonsillectomy    .  Tubal ligation    .  Carpal tunnel release  2005    Family History   Problem  Relation  Age of Onset   .  Hyperlipidemia  Mother    .  Hypertension  Mother    .  Heart disease  Mother    .  Cancer  Father       prostate    .  Hyperlipidemia  Father    .  Hypertension  Father    .  Heart disease  Father    .  Cancer  Maternal Aunt       non-hodgins lymphoma    .  Heart disease  Maternal Grandmother    .  Diabetes  Maternal Grandfather    .  Heart disease  Maternal Grandfather    .  Stroke  Paternal Grandmother    .  Heart disease  Paternal Grandmother     Social History  History   Substance Use Topics   .  Smoking status:  Former Games developer   .  Smokeless tobacco:  Never Used   .  Alcohol Use:  No      2 galsses of wine per week    Allergies   Allergen  Reactions   .  Ceclor (Cefaclor)    .  Sulfa Antibiotics    .  Tetracyclines & Related     Current Outpatient Prescriptions   Medication  Sig  Dispense  Refill   .  fexofenadine (ALLEGRA) 180 MG tablet  Take 180 mg by mouth daily.      Review of Systems  Review of Systems  Constitutional: Negative.  HENT: Negative.  Eyes: Negative.  Respiratory: Negative.  Cardiovascular: Negative.  Gastrointestinal: Positive for nausea and abdominal pain. Negative for vomiting, diarrhea and blood in stool.  Genitourinary:  Negative.  Musculoskeletal: Negative.  Neurological: Negative.  Hematological: Negative.  Psychiatric/Behavioral: Negative.   Blood pressure 150/100, pulse 60, temperature 97.2 F (36.2 C), temperature source Temporal, resp. rate 16, height 4' 10.5" (1.486 m), weight 199 lb 9.6 oz (90.538 kg), last menstrual period 02/06/2012.  Physical Exam  Physical Exam  Constitutional: She appears well-developed and well-nourished.  HENT:  Head: Normocephalic and atraumatic.  Eyes: Conjunctivae and EOM are normal. Pupils are equal, round, and reactive to light.  Cardiovascular: Normal rate, regular rhythm and normal heart sounds.  Pulmonary/Chest: Effort normal and breath sounds normal.  Abdominal: Soft. Normal appearance and bowel sounds are normal. There is tenderness in the right upper quadrant and epigastric area. There is no rebound and no guarding.    Musculoskeletal: Normal range of motion.  Neurological: She is alert. She has normal reflexes.  Skin: Skin is warm and dry.  Psychiatric: She has a normal mood and affect. Her behavior is normal. Judgment and thought content normal.   Data Reviewed  Office notes from Dr. Loreta Ave, labs, endoscopy report with biopsy.  Assessment   Symptomatic biliary dyskinesia   Plan   Lap chole with IOC  Risks and benefits have been explained to the patient and her husband and they wish to proceed

## 2012-04-03 NOTE — Progress Notes (Signed)
Aprox. 45 mins ago pt  Did feel nauseous... She  Rested  And ?? Napped ... Nausea has subsided and she wishes to go home ... Her pain is tolerable .Marland Kitchen        She has had clear liquids and tolerated them .Marland Kitchen

## 2012-04-04 ENCOUNTER — Encounter (INDEPENDENT_AMBULATORY_CARE_PROVIDER_SITE_OTHER): Payer: Self-pay

## 2012-04-05 ENCOUNTER — Encounter (HOSPITAL_COMMUNITY): Payer: Self-pay | Admitting: General Surgery

## 2012-04-24 ENCOUNTER — Encounter (INDEPENDENT_AMBULATORY_CARE_PROVIDER_SITE_OTHER): Payer: Self-pay | Admitting: General Surgery

## 2012-04-24 ENCOUNTER — Ambulatory Visit (INDEPENDENT_AMBULATORY_CARE_PROVIDER_SITE_OTHER): Payer: BC Managed Care – PPO | Admitting: General Surgery

## 2012-04-24 VITALS — BP 150/70 | HR 68 | Temp 97.2°F | Resp 16 | Ht 58.5 in | Wt 198.2 lb

## 2012-04-24 DIAGNOSIS — Z09 Encounter for follow-up examination after completed treatment for conditions other than malignant neoplasm: Secondary | ICD-10-CM

## 2012-04-24 NOTE — Progress Notes (Signed)
The patient is doing well status post her laparoscopic cholecystectomy  The dressings from his shoulder wounds were removed. The wounds have healed well with no evidence of infection. She is eating well with minimal discomfort. She had a small amount of diarrhea postoperatively but otherwise is doing okay.  On examination she has a mild amount of tenderness just anterior and to the right of her umbilicus but no evidence of a bulge or hernia.  She is released to do her usual activity and to return to see me on a when necessary basis the

## 2012-08-26 IMAGING — NM NM HEPATO W/GB/PHARM/[PERSON_NAME]
3 series · 13 of 13 positions shown · non-contrast
Comparison: None
Correlation:  Ultrasound abdomen 11/30/2011

CLINICAL DATA: Upper mid central abdominal pain, nausea, fatty
food intolerance, worsening symptoms in past month

NUCLEAR MEDICINE HEPATOBILIARY IMAGING WITH GALLBLADDER EF:
TECHNIQUE: Sequential images of the abdomen were obtained for 60
minutes following intravenous administration of
radiopharmaceutical.  Patient then received an infusion of
ugm/kg of CCK analog intravenously over 30 minutes, and imaging was
continued for 30 minutes.  A time-activity curve was generated from
tracer within the gallbladder following CCK administration, and the
gallbladder ejection fraction was calculated.
Radiopharmaceutical:  5.0 mCi Tc-HHm mebrofenin
Pharmaceutical:  1.8 mcg CCK

[he hepatobiliary · 3.43mm/px · 6 of 50 frames shown (1 of 3)]
[frame 5/50]
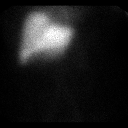
[frame 13/50]
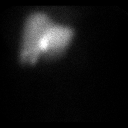
[frame 21/50]
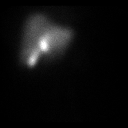
[frame 30/50]
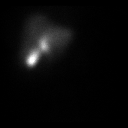
[frame 38/50]
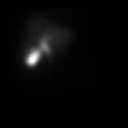
[frame 46/50]
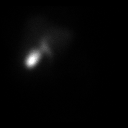

[he hepatobiliary · 3.43mm/px · 6 of 30 frames shown (2 of 3)]
[frame 3/30]
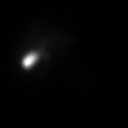
[frame 8/30]
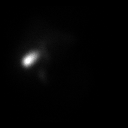
[frame 13/30]
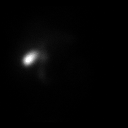
[frame 18/30]
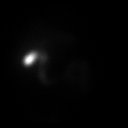
[frame 23/30]
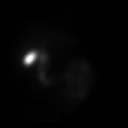
[frame 28/30]
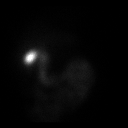

[he hepatobiliary · 1 of 1 slices shown (3 of 3)]
[im 1/1]
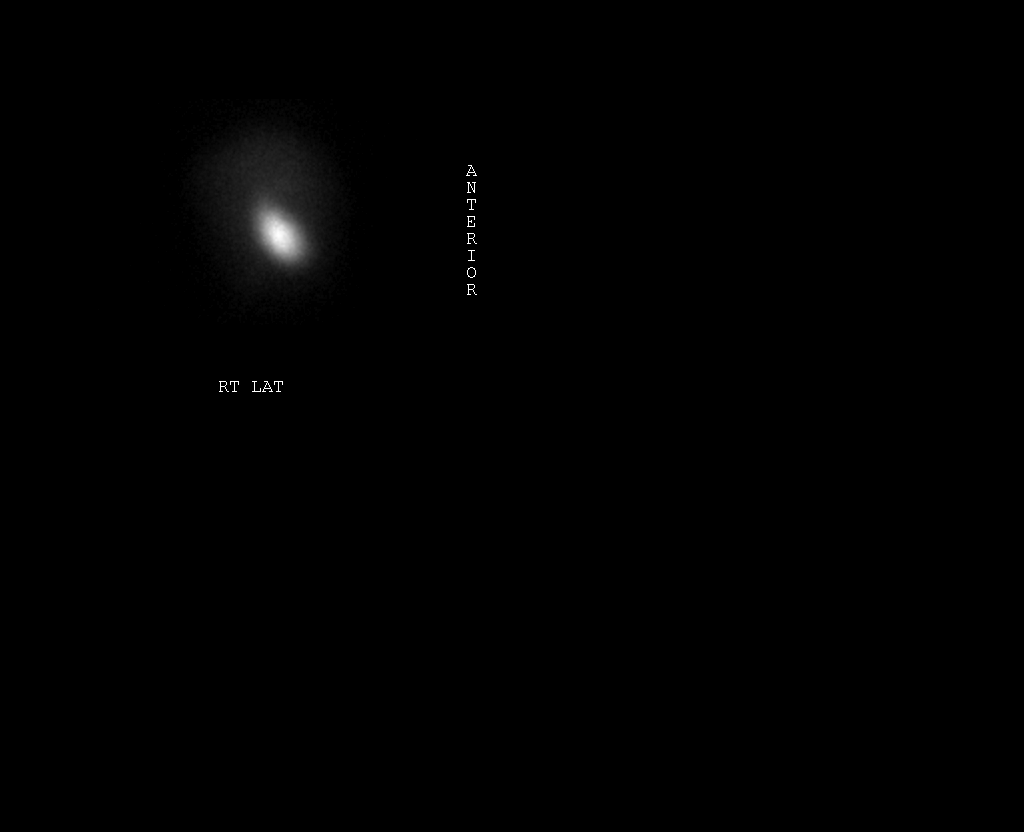

[13 of 13 positions shown; findings below may reference images not displayed]

FINDINGS: Prompt tracer extraction from bloodstream, indicating normal
hepatocellular function.
Prompt excretion of tracer into biliary tree.
Gallbladder visualized at 18 minutes.
Small bowel tracer not visualized until following CCK stimulation.
No hepatic retention of tracer.

Subjectively normal emptying of tracer from gallbladder following
CCK stimulation.
Calculated gallbladder ejection fraction is 43%, normal.
Patient experienced no symptoms following CCK administration.
IMPRESSION: Normal exam.

Normal values for gallbladder ejection fraction:
> 30% for exams utilizing sincalide (CCK)
> 50% for exams utilizing fatty meal stimulation

## 2012-10-22 ENCOUNTER — Other Ambulatory Visit: Payer: Self-pay

## 2012-10-22 DIAGNOSIS — Z1231 Encounter for screening mammogram for malignant neoplasm of breast: Secondary | ICD-10-CM

## 2012-11-14 ENCOUNTER — Ambulatory Visit
Admission: RE | Admit: 2012-11-14 | Discharge: 2012-11-14 | Disposition: A | Discharge status: Home / Self Care | Payer: BC Managed Care – PPO | Source: Ambulatory Visit

## 2012-11-14 DIAGNOSIS — Z1231 Encounter for screening mammogram for malignant neoplasm of breast: Secondary | ICD-10-CM

## 2013-03-13 ENCOUNTER — Ambulatory Visit
Admission: RE | Admit: 2013-03-13 | Discharge: 2013-03-13 | Disposition: A | Discharge status: Home / Self Care | Payer: BC Managed Care – PPO | Source: Ambulatory Visit | Attending: Allergy and Immunology | Admitting: Allergy and Immunology

## 2013-03-13 ENCOUNTER — Other Ambulatory Visit: Payer: Self-pay | Admitting: Allergy and Immunology

## 2013-03-13 DIAGNOSIS — J45909 Unspecified asthma, uncomplicated: Secondary | ICD-10-CM

## 2013-05-17 ENCOUNTER — Telehealth: Payer: Self-pay | Admitting: Obstetrics & Gynecology

## 2013-05-17 NOTE — Telephone Encounter (Signed)
Patient returning call regarding appointment. Advised we can see her at 1100 on 05-21-13, come at 1030 for registration and Dr Hyacinth Meeker will do colpo same day.  Patient states she was told this was not urgernt and was just for a repeat pap. She reports she has had colpos before and becomes very vagal and is not able to return to work.  She has meetings all day Tuesday beginning at noon and she must be able to plan to an early morning or late pm appointment.  Appt scheduled for 05-29-13 at 1 pm but does not want colpo on this day, she will discuss with Dr Hyacinth Meeker at that time.

## 2013-05-17 NOTE — Telephone Encounter (Signed)
Called patient re: incoming doctor referral. Per Dr. Hyacinth Meeker, patient can be seen 05/21/13 at 10:30. Patient has been pre-certed for a colposcopy already.

## 2013-05-24 NOTE — Telephone Encounter (Signed)
Per French Ana at [facility] [place] this appointment is not needed. The patient will have a repeat pap at their office first, then they will re-refer if necessary. No need keep records on file per referring doctor's office.

## 2013-05-24 NOTE — Telephone Encounter (Signed)
Pt husband called to cancel appointment for Wednesday. Says he got the automated reminder and pt does not need to come in. Ask was pt available to speak with me and cancel but she was not. Before cancelling I asked Starla to look into because pt was coming as a Dr. Referral for a colposcopy.

## 2013-05-24 NOTE — Telephone Encounter (Signed)
Before cancelling the appointment I put in a call to the referring doctor's office to confirm the appointment is not needed. [facility] will call us back to confirm and is pleased we did not cancel the appointment yet.

## 2013-05-29 ENCOUNTER — Ambulatory Visit: Payer: BC Managed Care – PPO | Admitting: Obstetrics & Gynecology

## 2013-07-18 ENCOUNTER — Other Ambulatory Visit: Payer: Self-pay | Admitting: Gynecology

## 2013-08-02 ENCOUNTER — Emergency Department (HOSPITAL_BASED_OUTPATIENT_CLINIC_OR_DEPARTMENT_OTHER)
Admission: EM | Admit: 2013-08-02 | Discharge: 2013-08-02 | Disposition: A | Discharge status: Home / Self Care | Payer: BC Managed Care – PPO | Attending: Emergency Medicine | Admitting: Emergency Medicine

## 2013-08-02 ENCOUNTER — Encounter (HOSPITAL_BASED_OUTPATIENT_CLINIC_OR_DEPARTMENT_OTHER): Payer: Self-pay | Admitting: Emergency Medicine

## 2013-08-02 DIAGNOSIS — Z87891 Personal history of nicotine dependence: Secondary | ICD-10-CM | POA: Insufficient documentation

## 2013-08-02 DIAGNOSIS — L02239 Carbuncle of trunk, unspecified: Secondary | ICD-10-CM | POA: Insufficient documentation

## 2013-08-02 DIAGNOSIS — K219 Gastro-esophageal reflux disease without esophagitis: Secondary | ICD-10-CM | POA: Insufficient documentation

## 2013-08-02 DIAGNOSIS — E785 Hyperlipidemia, unspecified: Secondary | ICD-10-CM | POA: Insufficient documentation

## 2013-08-02 DIAGNOSIS — I10 Essential (primary) hypertension: Secondary | ICD-10-CM | POA: Insufficient documentation

## 2013-08-02 DIAGNOSIS — N6089 Other benign mammary dysplasias of unspecified breast: Secondary | ICD-10-CM

## 2013-08-02 DIAGNOSIS — Z79899 Other long term (current) drug therapy: Secondary | ICD-10-CM | POA: Insufficient documentation

## 2013-08-02 DIAGNOSIS — L0293 Carbuncle, unspecified: Secondary | ICD-10-CM

## 2013-08-02 DIAGNOSIS — L02229 Furuncle of trunk, unspecified: Principal | ICD-10-CM | POA: Insufficient documentation

## 2013-08-02 MED ORDER — AMOXICILLIN-POT CLAVULANATE 500-125 MG PO TABS: 1.0000 | ORAL_TABLET | Freq: Three times a day (TID) | ORAL | Status: AC

## 2013-08-02 MED ORDER — FLUCONAZOLE 200 MG PO TABS: ORAL_TABLET | ORAL | Status: AC

## 2013-08-02 NOTE — ED Provider Notes (Signed)
CSN: [id number]     Arrival date & time 08/02/13  1812 History  This chart was scribed for Jackie Porter, MD by Blanchard Kelch, ED Scribe. The patient was seen in room MH01/MH01. Patient's care was started at 6:30 PM.    Chief Complaint  Patient presents with  . Abscess    Patient is a 48 y.o. female presenting with abscess. The history is provided by the patient. No language interpreter was used.  Abscess Associated symptoms: no fatigue, no fever, no headaches, no nausea and no vomiting     HPI Comments: Jackie Waller is a 48 y.o. female who presents to the Emergency Department complaining of a constant, waxing and waning abscess that first appeared about about two months ago on the underside of her left breast. She describes the abscess as red, swollen and bruised. She states the abscess started draining clear and bloody fluid today. She also reports being sick with influenza and bronchitis for six days. She has been taking Tamiflu and a z-pack (three days remaining) with mild relief. Her last mammogram was in May and she states it was normal. She states that she is allergic to Ceclor, Sulfa antibiotics and Tetracyclines and related. She states she is prone to yeast infections with antibiotic use.    Past Medical History  Diagnosis Date  . Hyperlipemia   . Hypertension   . GERD (gastroesophageal reflux disease)   . Seasonal allergies    Past Surgical History  Procedure Laterality Date  . Appendectomy    . Tonsillectomy    . Tubal ligation    . Carpal tunnel release  2005    bilat  . Cholecystectomy  04/03/2012    Procedure: LAPAROSCOPIC CHOLECYSTECTOMY WITH INTRAOPERATIVE CHOLANGIOGRAM;  Surgeon: Cherylynn Ridges, MD;  Location: [facility] OR;  Service: General;  Laterality: N/A;   Family History  Problem Relation Age of Onset  . Hyperlipidemia Mother   . Hypertension Mother   . Heart disease Mother   . Cancer Father     prostate  . Hyperlipidemia Father   . Hypertension  Father   . Heart disease Father   . Cancer Maternal Aunt     non-hodgins lymphoma  . Heart disease Maternal Grandmother   . Diabetes Maternal Grandfather   . Heart disease Maternal Grandfather   . Stroke Paternal Grandmother   . Heart disease Paternal Grandmother    History  Substance Use Topics  . Smoking status: Former Games developer  . Smokeless tobacco: Never Used  . Alcohol Use: 1.2 oz/week    2 Glasses of wine per week     Comment: 2 galsses of wine per week   OB History   Grav Para Term Preterm Abortions TAB SAB Ect Mult Living                 Review of Systems  Constitutional: Negative for fever, chills, diaphoresis, appetite change and fatigue.  HENT: Negative for mouth sores, sore throat and trouble swallowing.   Eyes: Negative for visual disturbance.  Respiratory: Negative for cough, chest tightness, shortness of breath and wheezing.   Cardiovascular: Negative for chest pain.  Gastrointestinal: Negative for nausea, vomiting, abdominal pain, diarrhea and abdominal distention.  Endocrine: Negative for polydipsia, polyphagia and polyuria.  Genitourinary: Negative for dysuria, frequency and hematuria.  Musculoskeletal: Negative for gait problem.  Skin: Positive for wound (abscess). Negative for color change, pallor and rash.  Neurological: Negative for dizziness, syncope, light-headedness and headaches.  Hematological: Does not bruise/bleed easily.  Psychiatric/Behavioral: Negative for behavioral problems and confusion.    Allergies  Ceclor; Sulfa antibiotics; and Tetracyclines & related  Home Medications   Current Outpatient Rx  Name  Route  Sig  Dispense  Refill  . Atorvastatin Calcium (LIPITOR PO)   Oral   Take by mouth.         . Dexlansoprazole (DEXILANT PO)   Oral   Take by mouth.         Marland Kitchen. lisinopril-hydrochlorothiazide (PRINZIDE,ZESTORETIC) 10-12.5 MG per tablet   Oral   Take 1 tablet by mouth daily.         Marland Kitchen. amoxicillin-clavulanate (AUGMENTIN)  500-125 MG per tablet   Oral   Take 1 tablet (500 mg total) by mouth every 8 (eight) hours.   21 tablet   0   . fexofenadine (ALLEGRA) 180 MG tablet   Oral   Take 180 mg by mouth daily.         . fluconazole (DIFLUCAN) 200 MG tablet      Once on the last day of antibiotic   1 tablet   0   . HYDROcodone-acetaminophen (VICODIN) 5-500 MG per tablet   Oral   Take 1 tablet by mouth every 6 (six) hours as needed for pain.   30 tablet   0   . ranitidine (ZANTAC) 150 MG tablet   Oral   Take 150 mg by mouth daily as needed.          Triage Vitals: BP 147/104  Pulse 105  Temp(Src) 98.6 F (37 C) (Oral)  Resp 20  Ht 4' 10.5" (1.486 m)  Wt 198 lb (89.812 kg)  BMI 40.67 kg/m2  SpO2 100%  LMP 07/02/2013  Physical Exam  Nursing note and vitals reviewed. Constitutional: She is oriented to person, place, and time. She appears well-developed and well-nourished. No distress.  HENT:  Head: Normocephalic and atraumatic.  Eyes: Conjunctivae are normal. No scleral icterus.  Neck: Normal range of motion. Neck supple.  Cardiovascular: Normal rate.   Pulmonary/Chest: Effort normal.  Abdominal: Soft.  Musculoskeletal: Normal range of motion.  Neurological: She is alert and oriented to person, place, and time.  Skin: Skin is warm and dry. No rash noted.  5 or 6 o clock on left breast area of erythema about 1 cm. No induration or fluctuance.  Psychiatric: She has a normal mood and affect. Her behavior is normal.    ED Course  Procedures (including critical care time)  DIAGNOSTIC STUDIES: Oxygen Saturation is 100% on room air, normal by my interpretation.    COORDINATION OF CARE: 6:35 PM -Will discharge with prescription for Augmentin and Diflucan due to history of yeast infections with antibiotic use. Patient verbalizes understanding and agrees with treatment plan.    Labs Review Labs Reviewed - No data to display Imaging Review No results found.  EKG Interpretation    None       MDM   1. Carbuncle   2. Sebaceous cyst of breast    Exam, and limited bedside ultrasound both show no sign of persistent abscess fluctuance induration or subcutaneous fluid collection. U compresses. Prescription. Gentle massage. Recheck if not improving.  I personally performed the services described in this documentation, which was scribed in my presence. The recorded information has been reviewed and is accurate.    Jackie PorterMark Pinki Rottman, MD 08/02/13 [phone]

## 2013-08-02 NOTE — ED Notes (Signed)
Pimple under her left breast that turned into an abscess. It opened tonight and now it is sore and looks bruised.

## 2013-08-02 NOTE — Discharge Instructions (Signed)
Warm compresses. Gentle massage. Antibiotic.  Breast Cyst Drainage Breast cysts are fluid-filled sacs that form when normal milk glands enlarge. Breast cysts are very common, especially in women between the ages of 13 and 71. Breast cysts can happen during the hormone changes around a menstrual period, or because you have too much of the hormone estrogen. Breast cysts can be as small as a pea or as large as a ping-pong ball. You and your caregiver have decided that the best way to remove and examine the fluid in your cyst is to drain (aspirate) it with a fine needle, into a syringe. This is a minor surgical procedure and takes just a few minutes to complete. You might feel a mild stinging when your caregiver injects a local anesthetic to numb the area. Otherwise, you should feel no pain during the procedure. Sometimes, a caregiver will use a treatment called ultrasound to guide the needle into the cyst. After the fluid is removed, your caregiver might send the fluid to a lab for further testing.  LET YOUR CAREGIVER KNOW ABOUT:  Allergies.  Medications taken, including herbs, creams, eye drops, and over-the-counter medicine.  Use of steroids (by mouth or creams).  Previous problems with anesthetics or mumbing medication.  History of bleeding or blood problems.  Other health problems.  Any past breast cysts and problems that may have occurred.  Family history of breast cancer.  Possibility of pregnancy, if this applies. RISKS AND COMPLICATIONS  Your caregiver will talk to you about possible problems before he or she performs the aspiration. Risks are small. You might have a little bruise on your breast. You might get an infection at the injection site. Other problems include:  Bleeding.  Feeling pain, because you are not numb from the anesthetic.  An allergic reaction or problem with the anesthetic.  The cyst coming back later. Your caregiver will explain what to do if you experience  any problems. BEFORE THE PROCEDURE   Do not take aspirin or blood thinners for 1 week before the procedure. These medications can cause bleeding.  You are allowed to eat and drink normally before the procedure. PROCEDURE  Breast cyst drainage is usually done in the caregiver's office. The breast is washed clean with an antiseptic solution. A local anesthetic is given to numb the skin over the area of the cyst. A needle is then inserted through the numbed area of the breast into the cyst, and the fluid is drained. The fluid is then sent to the lab for examination, to see if there are any abnormalities that need further treatment. AFTER THE PROCEDURE   The injection site will be a little numb for a while, but that should soon pass. Your caregiver will probably put a small bandage on the drainage site.  You might be able to drive home alone and go back to work or resume your normal activities after the procedure.  You may be instructed to put cold compresses on the breast, to prevent bleeding and swelling.  There may be bruising around the needle site. The bruising will go away in a couple of days. Finding out the results of your test Not all test results are available during your visit. If your test results are not back during the visit, make an appointment with your caregiver to find out the results. Do not assume everything is normal if you have not heard from your caregiver or the medical facility. It is important for you to follow up on  all of your test results.  Under normal circumstances, your caregiver will probably want to see you again in 4 to 6 weeks, to make sure the cyst has not filled up with fluid again. It is important to keep that appointment. Cysts that refill with fluid can pose a problem. HOME CARE INSTRUCTIONS   You may take pain medicine for discomfort, with your caregiver's permission.  Follow your caregiver's instructions on breast care.  Make an appointment for follow  up care. SEEK MEDICAL CARE IF:   You see cloudy discharge from the drainage site.  You see spreading redness of the skin around the drainage site.  Your pain or swelling around the drainage site gets worse. SEEK IMMEDIATE MEDICAL CARE IF:   You develop chest pain or tightness.  You develop a rapid heart rate.  You have shortness of breath.  You feel tired easily (fatigue).  Your skin turns bluish color.  You have a fever. Document Released: 03/29/2008 Document Revised: 09/12/2011 Document Reviewed: 03/29/2008 [facility] Patient Information 2014 [place], [place].  Abscess An abscess is an infected area that contains a collection of pus and debris.It can occur in almost any part of the body. An abscess is also known as a furuncle or boil. CAUSES  An abscess occurs when tissue gets infected. This can occur from blockage of oil or sweat glands, infection of hair follicles, or a minor injury to the skin. As the body tries to fight the infection, pus collects in the area and creates pressure under the skin. This pressure causes pain. People with weakened immune systems have difficulty fighting infections and get certain abscesses more often.  SYMPTOMS Usually an abscess develops on the skin and becomes a painful mass that is red, warm, and tender. If the abscess forms under the skin, you may feel a moveable soft area under the skin. Some abscesses break open (rupture) on their own, but most will continue to get worse without care. The infection can spread deeper into the body and eventually into the bloodstream, causing you to feel ill.  DIAGNOSIS  Your caregiver will take your medical history and perform a physical exam. A sample of fluid may also be taken from the abscess to determine what is causing your infection. TREATMENT  Your caregiver may prescribe antibiotic medicines to fight the infection. However, taking antibiotics alone usually does not cure an abscess. Your caregiver may need  to make a small cut (incision) in the abscess to drain the pus. In some cases, gauze is packed into the abscess to reduce pain and to continue draining the area. HOME CARE INSTRUCTIONS   Only take over-the-counter or prescription medicines for pain, discomfort, or fever as directed by your caregiver.  If you were prescribed antibiotics, take them as directed. Finish them even if you start to feel better.  If gauze is used, follow your caregiver's directions for changing the gauze.  To avoid spreading the infection:  Keep your draining abscess covered with a bandage.  Wash your hands well.  Do not share personal care items, towels, or whirlpools with others.  Avoid skin contact with others.  Keep your skin and clothes clean around the abscess.  Keep all follow-up appointments as directed by your caregiver. SEEK MEDICAL CARE IF:   You have increased pain, swelling, redness, fluid drainage, or bleeding.  You have muscle aches, chills, or a general ill feeling.  You have a fever. MAKE SURE YOU:   Understand these instructions.  Will watch your  condition.  Will get help right away if you are not doing well or get worse. Document Released: 03/30/2005 Document Revised: 12/20/2011 Document Reviewed: 09/02/2011 [facility] Patient Information 2014 [place], [place].

## 2013-08-05 ENCOUNTER — Other Ambulatory Visit: Payer: Self-pay | Admitting: Gynecology

## 2014-01-28 ENCOUNTER — Other Ambulatory Visit: Payer: Self-pay | Admitting: Gynecology

## 2014-01-28 DIAGNOSIS — R928 Other abnormal and inconclusive findings on diagnostic imaging of breast: Secondary | ICD-10-CM

## 2014-02-03 ENCOUNTER — Other Ambulatory Visit: Payer: Self-pay | Admitting: Gynecology

## 2014-02-03 ENCOUNTER — Ambulatory Visit
Admission: RE | Admit: 2014-02-03 | Discharge: 2014-02-03 | Disposition: A | Discharge status: Home / Self Care | Payer: BC Managed Care – PPO | Source: Ambulatory Visit | Attending: Gynecology | Admitting: Gynecology

## 2014-02-03 DIAGNOSIS — R928 Other abnormal and inconclusive findings on diagnostic imaging of breast: Secondary | ICD-10-CM

## 2014-02-03 DIAGNOSIS — N6001 Solitary cyst of right breast: Secondary | ICD-10-CM

## 2014-02-10 ENCOUNTER — Ambulatory Visit
Admission: RE | Admit: 2014-02-10 | Discharge: 2014-02-10 | Disposition: A | Discharge status: Home / Self Care | Payer: BC Managed Care – PPO | Source: Ambulatory Visit | Attending: Gynecology | Admitting: Gynecology

## 2014-02-10 DIAGNOSIS — N6001 Solitary cyst of right breast: Secondary | ICD-10-CM

## 2014-06-30 ENCOUNTER — Other Ambulatory Visit: Payer: Self-pay | Admitting: Gynecology

## 2014-07-01 LAB — CYTOLOGY - PAP
